# Patient Record
Sex: Male | Born: 1990 | Race: Black or African American | Hispanic: No | Marital: Single | Smoking: Never smoker
Health system: Southern US, Community
[De-identification: ages and names within clinical notes are randomized; demographics above are authoritative.]

## PROBLEM LIST (undated history)

## (undated) DIAGNOSIS — E7800 Pure hypercholesterolemia, unspecified: Secondary | ICD-10-CM

---

## 2010-07-12 ENCOUNTER — Emergency Department (HOSPITAL_COMMUNITY)
Admission: EM | Admit: 2010-07-12 | Discharge: 2010-07-12 | Disposition: A | Discharge status: Home / Self Care | Payer: Self-pay | Attending: Emergency Medicine | Admitting: Emergency Medicine

## 2010-07-12 DIAGNOSIS — L600 Ingrowing nail: Secondary | ICD-10-CM | POA: Insufficient documentation

## 2010-07-12 DIAGNOSIS — M79609 Pain in unspecified limb: Secondary | ICD-10-CM | POA: Insufficient documentation

## 2010-10-28 ENCOUNTER — Inpatient Hospital Stay (INDEPENDENT_AMBULATORY_CARE_PROVIDER_SITE_OTHER)
Admission: RE | Admit: 2010-10-28 | Discharge: 2010-10-28 | Disposition: A | Discharge status: Home / Self Care | Payer: Self-pay | Source: Ambulatory Visit | Attending: Emergency Medicine | Admitting: Emergency Medicine

## 2010-10-28 DIAGNOSIS — N342 Other urethritis: Secondary | ICD-10-CM

## 2012-09-16 ENCOUNTER — Emergency Department (HOSPITAL_COMMUNITY): Payer: No Typology Code available for payment source

## 2012-09-16 ENCOUNTER — Emergency Department (HOSPITAL_COMMUNITY)
Admission: EM | Admit: 2012-09-16 | Discharge: 2012-09-16 | Disposition: A | Discharge status: Home / Self Care | Payer: No Typology Code available for payment source | Attending: Emergency Medicine | Admitting: Emergency Medicine

## 2012-09-16 ENCOUNTER — Encounter (HOSPITAL_COMMUNITY): Payer: Self-pay | Admitting: Emergency Medicine

## 2012-09-16 DIAGNOSIS — S20219A Contusion of unspecified front wall of thorax, initial encounter: Secondary | ICD-10-CM | POA: Insufficient documentation

## 2012-09-16 DIAGNOSIS — F0781 Postconcussional syndrome: Secondary | ICD-10-CM | POA: Insufficient documentation

## 2012-09-16 DIAGNOSIS — Y9389 Activity, other specified: Secondary | ICD-10-CM | POA: Insufficient documentation

## 2012-09-16 DIAGNOSIS — S139XXA Sprain of joints and ligaments of unspecified parts of neck, initial encounter: Secondary | ICD-10-CM | POA: Insufficient documentation

## 2012-09-16 DIAGNOSIS — S0990XA Unspecified injury of head, initial encounter: Secondary | ICD-10-CM | POA: Insufficient documentation

## 2012-09-16 DIAGNOSIS — Y92410 Unspecified street and highway as the place of occurrence of the external cause: Secondary | ICD-10-CM | POA: Insufficient documentation

## 2012-09-16 DIAGNOSIS — S161XXA Strain of muscle, fascia and tendon at neck level, initial encounter: Secondary | ICD-10-CM

## 2012-09-16 HISTORY — DX: Pure hypercholesterolemia, unspecified: E78.00

## 2012-09-16 MED ORDER — NAPROXEN 375 MG PO TABS: 375.0000 mg | ORAL_TABLET | Freq: Two times a day (BID) | ORAL | Status: DC | PRN

## 2012-09-16 MED ORDER — IBUPROFEN 400 MG PO TABS
600.0000 mg | ORAL_TABLET | Freq: Once | ORAL | Status: AC
  Administered 2012-09-16: 600 mg via ORAL
  Filled 2012-09-16: qty 1
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
## 2013-06-11 ENCOUNTER — Encounter (HOSPITAL_COMMUNITY): Payer: Self-pay | Admitting: Emergency Medicine

## 2013-06-11 ENCOUNTER — Emergency Department (HOSPITAL_COMMUNITY)
Admission: EM | Admit: 2013-06-11 | Discharge: 2013-06-12 | Disposition: A | Discharge status: Home / Self Care | Payer: Managed Care, Other (non HMO) | Attending: Emergency Medicine | Admitting: Emergency Medicine

## 2013-06-11 ENCOUNTER — Emergency Department (HOSPITAL_COMMUNITY): Payer: Managed Care, Other (non HMO)

## 2013-06-11 DIAGNOSIS — F329 Major depressive disorder, single episode, unspecified: Secondary | ICD-10-CM | POA: Insufficient documentation

## 2013-06-11 DIAGNOSIS — R45851 Suicidal ideations: Secondary | ICD-10-CM | POA: Insufficient documentation

## 2013-06-11 DIAGNOSIS — F3289 Other specified depressive episodes: Secondary | ICD-10-CM | POA: Insufficient documentation

## 2013-06-11 DIAGNOSIS — F411 Generalized anxiety disorder: Secondary | ICD-10-CM | POA: Insufficient documentation

## 2013-06-11 DIAGNOSIS — S61209A Unspecified open wound of unspecified finger without damage to nail, initial encounter: Secondary | ICD-10-CM | POA: Insufficient documentation

## 2013-06-11 DIAGNOSIS — F39 Unspecified mood [affective] disorder: Secondary | ICD-10-CM | POA: Insufficient documentation

## 2013-06-11 DIAGNOSIS — Z862 Personal history of diseases of the blood and blood-forming organs and certain disorders involving the immune mechanism: Secondary | ICD-10-CM | POA: Insufficient documentation

## 2013-06-11 DIAGNOSIS — X789XXA Intentional self-harm by unspecified sharp object, initial encounter: Secondary | ICD-10-CM | POA: Insufficient documentation

## 2013-06-11 DIAGNOSIS — Z8639 Personal history of other endocrine, nutritional and metabolic disease: Secondary | ICD-10-CM | POA: Insufficient documentation

## 2013-06-11 DIAGNOSIS — Z23 Encounter for immunization: Secondary | ICD-10-CM | POA: Insufficient documentation

## 2013-06-11 DIAGNOSIS — S61409A Unspecified open wound of unspecified hand, initial encounter: Secondary | ICD-10-CM | POA: Insufficient documentation

## 2013-06-11 LAB — COMPREHENSIVE METABOLIC PANEL
ALK PHOS: 65 U/L (ref 39–117)
ALT: 46 U/L (ref 0–53)
AST: 40 U/L — AB (ref 0–37)
Albumin: 4.2 g/dL (ref 3.5–5.2)
BUN: 12 mg/dL (ref 6–23)
CALCIUM: 9.1 mg/dL (ref 8.4–10.5)
CHLORIDE: 101 meq/L (ref 96–112)
CO2: 24 meq/L (ref 19–32)
Creatinine, Ser: 0.87 mg/dL (ref 0.50–1.35)
GFR calc Af Amer: >90 mL/min (ref >90)
Glucose, Bld: 89 mg/dL (ref 70–99)
POTASSIUM: 4.6 meq/L (ref 3.7–5.3)
SODIUM: 138 meq/L (ref 137–147)
Total Protein: 7.9 g/dL (ref 6.0–8.3)

## 2013-06-11 LAB — SALICYLATE LEVEL: Salicylate Lvl: <2 mg/dL — ABNORMAL LOW (ref 2.8–20.0)

## 2013-06-11 LAB — CBC
HCT: 41.1 % (ref 39.0–52.0)
HEMOGLOBIN: 13.9 g/dL (ref 13.0–17.0)
MCH: 27.2 pg (ref 26.0–34.0)
MCHC: 33.8 g/dL (ref 30.0–36.0)
MCV: 80.4 fL (ref 78.0–100.0)
PLATELETS: 193 10*3/uL (ref 150–400)
RBC: 5.11 MIL/uL (ref 4.22–5.81)
RDW: 13.6 % (ref 11.5–15.5)
WBC: 5.5 10*3/uL (ref 4.0–10.5)

## 2013-06-11 LAB — ETHANOL: Alcohol, Ethyl (B): <11 mg/dL (ref 0–11)

## 2013-06-11 LAB — ACETAMINOPHEN LEVEL: Acetaminophen (Tylenol), Serum: <15 ug/mL (ref 10–30)

## 2013-06-11 MED ORDER — ALUM & MAG HYDROXIDE-SIMETH 200-200-20 MG/5ML PO SUSP: 30.0000 mL | ORAL | Status: DC | PRN

## 2013-06-11 MED ORDER — IBUPROFEN 200 MG PO TABS
600.0000 mg | ORAL_TABLET | Freq: Three times a day (TID) | ORAL | Status: DC | PRN
  Administered 2013-06-12: 600 mg via ORAL
  Filled 2013-06-11: qty 3

## 2013-06-11 MED ORDER — LORAZEPAM 1 MG PO TABS: 1.0000 mg | ORAL_TABLET | Freq: Three times a day (TID) | ORAL | Status: DC | PRN

## 2013-06-11 MED ORDER — ONDANSETRON HCL 4 MG PO TABS: 4.0000 mg | ORAL_TABLET | Freq: Three times a day (TID) | ORAL | Status: DC | PRN

## 2013-06-11 NOTE — ED Notes (Signed)
Patient refusing lab draw  until his hand is "checked out". Triage RN made aware.
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
## 2013-06-11 NOTE — BH Assessment (Addendum)
[facility] Assessment Progress Note  At 22:25 I [facility] in anticipation of [facility] assessment.  However, when I entered the pt's room at 22:35, pt needed further medical treatment before he was ready to be assessed.  Doylene Canninghomas Margurete Guaman, MA Triage Specialist 06/11/2013 @ 22:44
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
## 2013-06-12 DIAGNOSIS — F39 Unspecified mood [affective] disorder: Secondary | ICD-10-CM

## 2013-06-12 LAB — RAPID URINE DRUG SCREEN, HOSP PERFORMED
AMPHETAMINES: NOT DETECTED
BARBITURATES: NOT DETECTED
Benzodiazepines: NOT DETECTED
Cocaine: NOT DETECTED
OPIATES: NOT DETECTED
TETRAHYDROCANNABINOL: POSITIVE — AB

## 2013-06-12 MED ORDER — TETANUS-DIPHTH-ACELL PERTUSSIS 5-2.5-18.5 LF-MCG/0.5 IM SUSP
0.5000 mL | Freq: Once | INTRAMUSCULAR | Status: AC
  Administered 2013-06-12: 0.5 mL via INTRAMUSCULAR
  Filled 2013-06-12: qty 0.5

## 2013-06-12 MED ORDER — HYDROXYZINE HCL 10 MG PO TABS: 50.0000 mg | ORAL_TABLET | Freq: Four times a day (QID) | ORAL | Status: DC | PRN

## 2013-06-12 MED ORDER — HYDROXYZINE HCL 25 MG PO TABS
50.0000 mg | ORAL_TABLET | Freq: Once | ORAL | Status: AC
  Administered 2013-06-12: 50 mg via ORAL
  Filled 2013-06-12: qty 2

## 2013-06-12 MED ORDER — CEPHALEXIN 500 MG PO CAPS
500.0000 mg | ORAL_CAPSULE | Freq: Four times a day (QID) | ORAL | Status: DC
  Administered 2013-06-12 (×3): 500 mg via ORAL
  Filled 2013-06-12 (×3): qty 1

## 2013-06-12 NOTE — Progress Notes (Signed)
Patient ID: Nicholas Cowan, male   DOB: 12/17/1990, 23 y.o.   MRN: [id number]  Patient is going home; girlfriend is willing to have him back. [facility]. Will discharge with Hydroxyzine 50 mg BID Prn anxiety/agitation. He is to follow up for anger management and impulsivity with [facility]. He denies SI/HI/AVH.   Psychiatric Specialty Exam: Physical Exam  ROS  Blood pressure 112/68, pulse 81, temperature 98.2 F (36.8 C), temperature source Oral, resp. rate 16, height 5\' 11"  (1.803 m), weight 102.059 kg (225 lb), SpO2 100.00%.Body mass index is 31.39 kg/(m^2).  General Appearance: Casual  Eye Contact::  Fair  Speech:  Normal Rate  Volume:  Normal  Mood:  Euthymic  Affect:  Constricted  Thought Process:  Coherent, Goal Directed and Intact  Orientation:  Full (Time, Place, and Person)  Thought Content:  WDL  Suicidal Thoughts:  No  Homicidal Thoughts:  No  Memory:  Immediate;   Fair Recent;   Fair Remote;   Fair  Judgement:  Fair  Insight:  Fair  Psychomotor Activity:  Normal  Concentration:  Fair  Recall:  Fair  Akathisia:  No  Handed:  Right  AIMS (if indicated):   0  Assets:  Leisure Time Physical Health Resilience Social Support  Sleep:   fair

## 2013-06-12 NOTE — ED Notes (Signed)
Code Start is entry error.

## 2013-06-12 NOTE — ED Notes (Signed)
Pt currently denies SI/HI/AVH.  Pt states that he was hearing voices earlier.  Pt refuses to talk about the situation at the present moment.  Pt guarded and forwards little.

## 2013-06-12 NOTE — Progress Notes (Signed)
[facility] provided pt with a [employer] Card application, highlighting [facility].  °

## 2013-06-12 NOTE — BH Assessment (Signed)
LCS[facility] as [facility] is looking at potential discharge. Patient's girlfriend, Ena DawleyLekiah Solomon at [phone], reports she has no concerns should patient be discharged regarding patient's safety. Glean HessLekiah reports patient has been stressed of late over household bills and conflict with father. Both patient and patient's girlfriend are open to patient following up in community resources for therapy. Patient aware of how to [facility] and [address]. Patient willing to sign 'no harm contract.' LCSW have patient sign. Carney Bernatherine C Harrill, LCSW

## 2013-06-12 NOTE — [facility] Suicide Risk Assessment (Cosign Needed)
Suicide Risk Assessment  Discharge Assessment     Demographic Factors:  Male, Adolescent or young adult and Low socioeconomic status  Mental Status Per Nursing Assessment::   On Admission:     Current Mental Status by Physician: NA  Loss Factors: Decrease in vocational status, Decline in physical health and Financial problems/change in socioeconomic status  Historical Factors: NA  Risk Reduction Factors:   Positive social support  Continued Clinical Symptoms:  Severe Anxiety and/or Agitation  Cognitive Features That Contribute To Risk:  Closed-mindedness Loss of executive function Polarized thinking Thought constriction (tunnel vision)    Suicide Risk:  Minimal: No identifiable suicidal ideation.  Patients presenting with no risk factors but with morbid ruminations; may be classified as minimal risk based on the severity of the depressive symptoms  Discharge Diagnoses:   AXIS I:  Anxiety Disorder NOS AXIS II:  Deferred AXIS III:   Past Medical History  Diagnosis Date  . Hypercholesteremia    AXIS IV:  economic problems, educational problems, housing problems, occupational problems, other psychosocial or environmental problems, problems related to legal system/crime, problems related to social environment, problems with access to health care services and problems with primary support group AXIS V:  61-70 mild symptoms  Plan Of Care/Follow-up recommendations:  Activity:  as tolerated Diet:  regular Tests:  na Other:  na  Is patient on multiple antipsychotic therapies at discharge:  No   Has Patient had three or more failed trials of antipsychotic monotherapy by history:  No  Recommended Plan for Multiple Antipsychotic Therapies: NA  Terance Pomplun 06/12/2013, 4:09 PM

## 2013-06-12 NOTE — Consult Note (Signed)
[facility] Face-to-Face Psychiatry Consult   Reason for Consult:  Referral for psychiatric evaluation Referring Physician:  EDP Yao/Schinlever, PA-C Nicholas Cowan is an 23 y.o. male.  Assessment: AXIS I:  Mood Disorder NOS AXIS II:  Deferred AXIS III:   Past Medical History  Diagnosis Date  . Hypercholesteremia    AXIS IV:  economic problems, other psychosocial or environmental problems and problems with primary support group AXIS V:  41-50 serious symptoms  Plan:  No evidence of imminent risk to self or others at present.    Subjective:   Nicholas Cowan is a 23 y.o. male patient who presented to [facility] for evaluation of depression after sustaining a hand injury. Patient states "I lost my mind. I wasn't thinking straight." Patient states that tonight his girlfriend found condoms in his car. His girlfriend then told him "she wasn't worried about it and just wait til he found condoms in her purse." Patient states that my girlfriend "yelled at me and told me to go on to work." Patient states I don't even know how to handle things. I lost my mind and started crying." Patient states he smashed his hand into a glass table. Patient states "I am upset all the time. I'm scared to die but I think about dying. I'm tired of being upset. I'm tired of being depressed." Patient further states that he "has issues with anger." States when he gets mad sometimes "I hit myself in the face, tear my shirt or cut myself." Patient states that he has only had one incident of cutting himself and that was 01/2013. Patient states that he doesn't have anyone he can talk to; states his mother couldn't handle some of the things he needs to say and he states that his father is a Theme park manager. States "I have a lot of resentment towards my dad." Patient states that "I keep dreaming about cars getting into a wreck. I see visions of accidents and sometimes I hear voices in my dreams. I also dream that someone is trying to kill me." Patient states  that he has gained 30+ lbs over the last 6 months - a year due to eating when he is sad. Reports sleeping 5-6 hours at night; sometimes less. Reports stressors as his relationship with his girlfriend and that he lost his full-time job in December right before Christmas.  Patient denies SI or HI at this time.  HPI: 23 yo male patient who presents for evaluation of depression. HPI Elements:   Location:  Depression and Anger Outburst. Quality:  Depressed and Hopeless. Severity:  Moderate to Severe. Timing:  Frequent episodes of depression and anger. Duration:  6 months to a year. Context:  Argument with girlfriend and increased life stressors.  Past Psychiatric History: Past Medical History  Diagnosis Date  . Hypercholesteremia     reports that he has never smoked. He does not have any smokeless tobacco history on file. He reports that he does not drink alcohol or use illicit drugs. No family history on file.         Allergies:  No Known Allergies  ACT Assessment Complete:  No:   Past Psychiatric History: No Diagnosis:  Mood Disorder  Hospitalizations:  None  Outpatient Care:  None  Substance Abuse Care:  None  Self-Mutilation:  Yes. One episode of cutting 01/2013.  Suicidal Attempts:  No  Homicidal Behaviors:  No   Violent Behaviors: Yes, smashed hand through glass table.    Place of Residence:  Lives with his  girlfriend.  Marital Status:  Single Employed/Unemployed:  Employed part-time EducationField seismologist Family Supports:  Mom and Father Objective: Blood pressure 137/76, pulse 68, temperature 98.2 F (36.8 C), temperature source Oral, resp. rate 15, height 5' 11"  (1.803 m), weight 102.059 kg (225 lb), SpO2 99.00%.Body mass index is 31.39 kg/(m^2). Results for orders placed during the hospital encounter of 06/11/13 (from the past 72 hour(s))  ACETAMINOPHEN LEVEL     Status: None   Collection Time    06/11/13  9:20 PM      Result Value Range    Acetaminophen (Tylenol), Serum <15.0  10 - 30 ug/mL   Comment:            THERAPEUTIC CONCENTRATIONS VARY     SIGNIFICANTLY. A RANGE OF 10-30     ug/mL MAY BE AN EFFECTIVE     CONCENTRATION FOR MANY PATIENTS.     HOWEVER, SOME ARE BEST TREATED     AT CONCENTRATIONS OUTSIDE THIS     RANGE.     ACETAMINOPHEN CONCENTRATIONS     >150 ug/mL AT 4 HOURS AFTER     INGESTION AND >50 ug/mL AT 12     HOURS AFTER INGESTION ARE     OFTEN ASSOCIATED WITH TOXIC     REACTIONS.  CBC     Status: None   Collection Time    06/11/13  9:20 PM      Result Value Range   WBC 5.5  4.0 - 10.5 K/uL   RBC 5.11  4.22 - 5.81 MIL/uL   Hemoglobin 13.9  13.0 - 17.0 g/dL   HCT 41.1  39.0 - 52.0 %   MCV 80.4  78.0 - 100.0 fL   MCH 27.2  26.0 - 34.0 pg   MCHC 33.8  30.0 - 36.0 g/dL   RDW 13.6  11.5 - 15.5 %   Platelets 193  150 - 400 K/uL  COMPREHENSIVE METABOLIC PANEL     Status: Abnormal   Collection Time    06/11/13  9:20 PM      Result Value Range   Sodium 138  137 - 147 mEq/L   Potassium 4.6  3.7 - 5.3 mEq/L   Comment: MODERATE HEMOLYSIS     HEMOLYSIS AT THIS LEVEL MAY AFFECT RESULT   Chloride 101  96 - 112 mEq/L   CO2 24  19 - 32 mEq/L   Glucose, Bld 89  70 - 99 mg/dL   BUN 12  6 - 23 mg/dL   Creatinine, Ser 0.87  0.50 - 1.35 mg/dL   Calcium 9.1  8.4 - 10.5 mg/dL   Total Protein 7.9  6.0 - 8.3 g/dL   Albumin 4.2  3.5 - 5.2 g/dL   AST 40 (*) 0 - 37 U/L   Comment: MODERATE HEMOLYSIS     HEMOLYSIS AT THIS LEVEL MAY AFFECT RESULT   ALT 46  0 - 53 U/L   Comment: MODERATE HEMOLYSIS     HEMOLYSIS AT THIS LEVEL MAY AFFECT RESULT   Alkaline Phosphatase 65  39 - 117 U/L   Comment: MODERATE HEMOLYSIS     HEMOLYSIS AT THIS LEVEL MAY AFFECT RESULT   Total Bilirubin <0.2 (*) 0.3 - 1.2 mg/dL   GFR calc non Af Amer >90  >90 mL/min   GFR calc Af Amer >90  >90 mL/min   Comment: (NOTE)     The eGFR has been calculated using the CKD EPI equation.     This calculation has  not been validated in all clinical  situations.     eGFR's persistently <90 mL/min signify possible Chronic Kidney     Disease.  ETHANOL     Status: None   Collection Time    06/11/13  9:20 PM      Result Value Range   Alcohol, Ethyl (B) <11  0 - 11 mg/dL   Comment:            LOWEST DETECTABLE LIMIT FOR     SERUM ALCOHOL IS 11 mg/dL     FOR MEDICAL PURPOSES ONLY  SALICYLATE LEVEL     Status: Abnormal   Collection Time    06/11/13  9:20 PM      Result Value Range   Salicylate Lvl <4.9 (*) 2.8 - 20.0 mg/dL   Labs are reviewed and are essentially negative. No UDS available for review.  Current Facility-Administered Medications  Medication Dose Route Frequency Provider Last Rate Last Dose  . alum & mag hydroxide-simeth (MAALOX/MYLANTA) 200-200-20 MG/5ML suspension 30 mL  30 mL Oral PRN Arville Lime Schinlever, PA-C      . cephALEXin (KEFLEX) capsule 500 mg  500 mg Oral Q6H Catherine E Schinlever, PA-C      . hydrOXYzine (ATARAX/VISTARIL) tablet 50 mg  50 mg Oral Once Lurena Nida, NP      . ibuprofen (ADVIL,MOTRIN) tablet 600 mg  600 mg Oral Q8H PRN Remer Macho, PA-C      . LORazepam (ATIVAN) tablet 1 mg  1 mg Oral Q8H PRN Arville Lime Schinlever, PA-C      . ondansetron (ZOFRAN) tablet 4 mg  4 mg Oral Q8H PRN Remer Macho, PA-C       No current outpatient prescriptions on file.    Psychiatric Specialty Exam:     Blood pressure 137/76, pulse 68, temperature 98.2 F (36.8 C), temperature source Oral, resp. rate 15, height 5' 11"  (1.803 m), weight 102.059 kg (225 lb), SpO2 99.00%.Body mass index is 31.39 kg/(m^2).  General Appearance: Fairly Groomed  Engineer, water::  Fair  Speech:  Clear and Coherent  Volume:  Decreased  Mood:  Depressed  Affect:  Blunt  Thought Process:  Coherent  Orientation:  Full (Time, Place, and Person)  Thought Content:  WDL  Suicidal Thoughts:  No  Homicidal Thoughts:  No  Memory:  Immediate;   Good Recent;   Good Remote;   Good  Judgement:  Fair  Insight:  Fair   Psychomotor Activity:  Normal  Concentration:  Good  Recall:  Good  Akathisia:  No  Handed:  Right  AIMS (if indicated):     Assets:  Desire for Improvement Housing  Sleep:      Treatment Plan Summary: Patient will be evaluated by psychiatry in the morning.  Serena Colonel, FNP-BC 06/12/2013 1:15 AM

## 2013-06-12 NOTE — ED Provider Notes (Signed)
Medical screening examination/treatment/procedure(s) were performed by non-physician practitioner and as supervising physician I was immediately available for consultation/collaboration.  EKG Interpretation   None         Richardean Canalavid H Johnattan Strassman, MD 06/12/13 1459

## 2013-06-15 NOTE — Progress Notes (Signed)
Patient was examined by me, seems to be fairly stable but does acknowledge his problems with anger and is willing to followup with [facility]. Patient denies any suicidal ideation, any homicidal ideation, any delusions or paranoia her patient also denies having any thoughts of hurting himself or others. Patient can be discharged home and is to return to girlfriend's house

## 2013-06-16 NOTE — Consult Note (Signed)
Face to face evaluation, note reviewed and agreed with 

## 2013-06-29 ENCOUNTER — Encounter (HOSPITAL_COMMUNITY): Payer: Self-pay | Admitting: Emergency Medicine

## 2013-06-29 ENCOUNTER — Emergency Department (HOSPITAL_COMMUNITY)
Admission: EM | Admit: 2013-06-29 | Discharge: 2013-06-29 | Disposition: A | Discharge status: Home / Self Care | Payer: Managed Care, Other (non HMO) | Attending: Emergency Medicine | Admitting: Emergency Medicine

## 2013-06-29 DIAGNOSIS — Z862 Personal history of diseases of the blood and blood-forming organs and certain disorders involving the immune mechanism: Secondary | ICD-10-CM | POA: Insufficient documentation

## 2013-06-29 DIAGNOSIS — Z4802 Encounter for removal of sutures: Secondary | ICD-10-CM | POA: Insufficient documentation

## 2013-06-29 DIAGNOSIS — G8911 Acute pain due to trauma: Secondary | ICD-10-CM | POA: Insufficient documentation

## 2013-06-29 DIAGNOSIS — Z8639 Personal history of other endocrine, nutritional and metabolic disease: Secondary | ICD-10-CM | POA: Insufficient documentation

## 2013-06-29 DIAGNOSIS — M79609 Pain in unspecified limb: Secondary | ICD-10-CM | POA: Insufficient documentation

## 2013-06-29 MED ORDER — IBUPROFEN 600 MG PO TABS: 600.0000 mg | ORAL_TABLET | Freq: Four times a day (QID) | ORAL | Status: DC | PRN

## 2013-06-29 NOTE — ED Notes (Signed)
Wound care reviewed with pt. Dressings provided

## 2013-06-29 NOTE — ED Notes (Signed)
Pt here for suture removal from left. Lac is healing no redness and edges approximate. Sutures Place on 06/11/13.

## 2013-06-29 NOTE — Discharge Instructions (Signed)
Call for a follow up appointment with a Family or Primary Care Provider.  °Return if Symptoms worsen.   °Take medication as prescribed.  ° ° °Emergency Department Resource Guide °1) Find a Doctor and Pay Out of Pocket °Although you won't have to find out who is covered by your insurance plan, it is a good idea to ask around and get recommendations. You will then need to call the office and see if the doctor you have chosen will accept you as a new patient and what types of options they offer for patients who are self-pay. Some doctors offer discounts or will set up payment plans for their patients who do not have insurance, but you will need to ask so you aren't surprised when you get to your appointment. ° °2) Contact Your Local Health Department °Not all health departments have doctors that can see patients for sick visits, but many do, so it is worth a call to see if yours does. If you don't know where your local health department is, you can check in your phone book. The CDC also has a tool to help you locate your state's health department, and many state websites also have listings of all of their local health departments. ° °3) Find a Walk-in Clinic °If your illness is not likely to be very severe or complicated, you may want to try a walk in clinic. These are popping up all over the country in pharmacies, drugstores, and shopping centers. They're usually staffed by nurse practitioners or physician assistants that have been trained to treat common illnesses and complaints. They're usually fairly quick and inexpensive. However, if you have serious medical issues or chronic medical problems, these are probably not your best option. ° °No Primary Care Doctor: °- Call Health Connect at  832-8000 - they can help you locate a primary care doctor that  accepts your insurance, provides certain services, etc. °- Physician Referral Service- 1-800-533-3463 ° °Chronic Pain Problems: °Organization         Address  Phone    Notes  °[facility]  (336) 297-2271 Patients need to be referred by their primary care doctor.  ° °Medication Assistance: °Organization         Address  Phone   Notes  °[place] [facility] [address] °[place], [place] [address] (336) 641-8030 --Must be a resident of [place] °-- Must have NO insurance coverage whatsoever (no Medicaid/ Medicare, etc.) °-- The pt. MUST have a primary care doctor that directs their care regularly and follows them in the community °  °[facility]  (866) 331-1348   °[employer] Way  (888) 892-1162   ° °Agencies that provide inexpensive medical care: °Organization         Address  Phone   Notes  °[facility]  (336) 832-8035   °[facility]    (336) 832-7272   °[facility] [address] °[place], [place] [address] (336) 832-4777   °[facility] [address], °[place] (336) 271-4999   °[facility]    (336) 373-0678   °[facility]    (336) 272-1050   °[facility] ° [address], [place] Phone:  (336) 832-4444, Fax:  (336) 832-4440 Hours of Operation:  9 am - 6 pm, M-F.  Also accepts Medicaid/Medicare and self-pay.  °[facility] ° [address], [place]   Phone: (336) 832-3150, Fax: (336) 832-3151. Hours of Operation:  8:30 am - 5:30 pm, M-F.  Also accepts Medicaid and self-pay.  °[facility] [place] [address], [place] Phone: (336) 878-6027   °[facility] [address], [place], [place] (336)723-1848, Ext. 123 Mondays & Thursdays: 7-9 AM.  First 15 patients are seen on a first come, first serve basis. °  ° °Medicaid-accepting [place] Providers: ° °Organization         Address  Phone   Notes  °[facility] [address], [place] (336) 641-2100 Also accepts self-pay patients.  °[facility]  [address], [place] ° (336) 856-9996   °[facility] [address], [place] (336) 288-8857   °[facility] [address] [place] [address], [place] (336) 299-7000   °Veita Bland [address], [place]  ° (336) 373-1557 Only accepts [place] Access Medicaid patients after they have their name applied to their card.  ° °Self-Pay (no insurance) in [place]: ° °Organization         Address  Phone   Notes  °Sickle Cell Patients, [facility] [address], [place] (336) 832-1970   °[facility] [address], [place] (336) 832-4400   °[facility] ° [address], [place] (336) 992-4800   °[facility]/Dr. Osei-Bonsu ° [address] [place] [address], [place] or [address], [place] (336) 841-8500 Phone number for both [place] and [place] locations is the same.  °[facility] [address], [place] (336) 299-0000   °[facility] [address] [place] [address], [place] or [address] (336) 852-7530 °(336) 878-2260   °[facility] [address], [place] (336) 350-1642, phone; (336) 294-5005, fax Sees patients 1st and 3rd Saturday of every month.  Must not qualify for public or private insurance (i.e. Medicaid, Medicare, [place] Health Choice, Veterans' Benefits) • Household income should be no more than 200% of the poverty level •The clinic cannot treat you if you are pregnant or think you are pregnant • Sexually transmitted diseases are not treated at the clinic.  ° ° °Dental Care: °Organization         Address  Phone  Notes  °[facility] [address],  (336) 641-6152 Accepts children up to age 21 who are enrolled in Medicaid or [place] Health Choice; pregnant women with a Medicaid card; and children who have  applied for Medicaid or [place] Health Choice, but were declined, whose parents can pay a reduced fee at time of service.  °[facility] [place]  [address], [place] (336) 641-7733 Accepts children up to age 21 who are enrolled in Medicaid or [place] Health Choice; pregnant women with a Medicaid card; and children who have applied for Medicaid or [place] Health Choice, but were declined, whose parents can pay a reduced fee at time of service.  °[facility] ° [address],  (336) 641-4533 Patients are seen by appointment only. Walk-ins are not accepted. [facility] will see patients 18 years of age and older. °Monday - Tuesday (8am-5pm) °Most Wednesdays (8:30-5pm) °$30 per visit, cash only  °[facility] ° [address], [place] (336) 641-4533 Patients are seen by appointment only. Walk-ins are not accepted. [facility] will see patients 18 years of age and older. °One Wednesday Evening (Monthly: Volunteer Based).  $30 per visit, cash only  °[facility]  (919) 537-3737 for adults; Children under age 4, call Graduate Pediatric Dentistry at (919) [phone]. Children aged 4-14, please call (919) [phone] to request a pediatric application. ° Dental services are provided in all areas of dental care including fillings, crowns and bridges, complete and partial dentures, implants, gum treatment, root canals, and extractions. Preventive care is also provided. Treatment is provided to both adults and children. °Patients are selected via a lottery and there is often a waiting list. °  °[facility] [address], °[place] ° (336) 763-8833 [email or web address] °  °[facility] [address], [place], [place] (336)723-1848, Ext. 123 Second and Fourth Thursday of each month, opens at 6:30 AM; Clinic ends at 9 AM.  Patients are seen on a first-come first-served basis, and a  limited number are seen during each clinic.  ° °[facility] ° [address], [place], [place] (336) 723-7904   Eligibility Requirements °You must have lived in [place], [place], or [place] counties for at least the last three months. °  You cannot be eligible for state or federal sponsored healthcare insurance, including Veterans Administration, Medicaid, or Medicare. °  You generally cannot be eligible for healthcare insurance through your employer.  °  How to apply: °Eligibility screenings are held every Tuesday and Wednesday afternoon from 1:00 pm until 4:00 pm. You do not need an appointment for the interview!  °[facility] [address], [place], [place] 336-631-2330   °[facility]  336-342-8273   °[facility]  336-703-3100   °[facility]  336-570-6415   ° °Behavioral Health Resources in the Community: °Intensive Outpatient Programs °Organization         Address  Phone  Notes  °[facility] [address], [place], [place] 336-878-6098   °[facility] [address], [place], [place] 336-832-9800   °[facility] [address], [place], [place] ° 336-882-2125   °[facility] [address],  °[place], [place] 1-800-853-5163 or 336-641-4981   °Substance Abuse Resources °Organization         Address  Phone  Notes  °[facility]  336-882-2125   °[facility]  336-784-9470   °[facility]  336-285-9073   °[facility]  336-845-3988   °Residential & Outpatient Substance Abuse Program  1-800-659-3381   °Psychological Services °Organization         Address  Phone  Notes  °[facility]  336- 832-9600   °[facility]  336- 378-7881   °[facility] [address], [place] 1-800-853-5163 or 336-641-4981   ° °Mobile Crisis Teams °Organization          Address  Phone  Notes  °[facility], Mobile Crisis Care Unit  1-877-626-1772   °Assertive °[facility] ° [address] [place], [place] 336-834-9664   °Sharon DeEsch [address] °[place]  336-554-5454   ° °Self-Help/Support Groups °Organization         Address  Phone             Notes  °[facility] [place] [facility] - variety of   support groups  336- 373-1402 Call for more information  °Narcotics Anonymous (NA), [facility] [address], °[place]  2 meetings at this location  ° °Residential Treatment Programs °Organization         Address  Phone  Notes  °[facility] [address],    °[place]  1-866-801-8205   °[facility] ° [address], [place], [place] 704-293-8524   °[facility] [address], [place] 336-845-3988 Admissions: 8am-3pm M-F  °[facility] [address]    °[place], [place] 336-841-1104   °[facility] [address], [place], [place] 336-379-7146   °[facility] [address]  °[place], [place] 336-285-9073   °[facility] - Intensive Outpatient [address], [place], [place] 336-852-3033   °[facility] [address]  °[place], [place] 1-877-615-2722 or 336-784-9470   °[facility] ([facility] [address] , [place] 336-227-7417 Accepts Medicaid  °[facility] [address]  °[place] 1-800-659-3381 Substance Abuse/Addiction Treatment  ° °[place] Behavioral Health Resources °Organization         Address  Phone  Notes  °[facility]  (888) 581-9988   °Julie Brannon, PhD [address] [place], [place]   (336) 349-5553 or (336) 951-0000   °[facility]   [address] °[place], [place] (336) 349-4454   °[facility] [address], [place], [place] (336) 342-8316 Insurance/Medicaid/sponsorship  through [facility]  °[facility] [address]                                    [place], [place] (336) 342-8316 Therapy/tele-psych/case  °[facility] [address]  ° [place], [place] (336) 349-2233    °Dr. Arfeen  (336) 349-4544   °[facility] [place]  [employer] [place] [facility] 1) [address],  °2) [address], [place] °3)  [address], [place] (336) 349-3220 °(336) 342-7768 ° °(336) 342-8140   °[place] Child Abuse [facility]line (336) 342-1394 or (336) 342-3537 (After Hours)    ° °

## 2013-06-29 NOTE — ED Provider Notes (Signed)
CSN: [id number]     Arrival date & time 06/29/13  1336 History  This chart was scribed for non-physician practitioner Mellody DrownLauren Machai Desmith, PA-C working with Flint MelterElliott L Wentz, MD by Dorothey Basemania Sutton, ED Scribe. This patient was seen in room WTR7/WTR7 and the patient's care was started at 3:13 PM.    Chief Complaint  Patient presents with  . Suture / Staple Removal    The history is provided by the patient. No language interpreter was used.   HPI Comments: Nicholas Cowan is a 23 y.o. male who presents to the Emergency Department requesting removal of 10 sutures from the left hand for a laceration that he sustained on 06/11/2013. He reports some associated mild pain to the area. He denies any surrounding erythema or drainage from the area. He denies fever, chills, nausea, emesis. Patient reports that his tetanus was updated at that time. Patient has a history of hypercholesterolemia.   Past Medical History  Diagnosis Date  . Hypercholesteremia    History reviewed. No pertinent past surgical history. No family history on file. History  Substance Use Topics  . Smoking status: Never Smoker   . Smokeless tobacco: Not on file  . Alcohol Use: No    Review of Systems  Constitutional: Negative for fever and chills.  Gastrointestinal: Negative for nausea and vomiting.  Musculoskeletal: Positive for myalgias.  Skin: Positive for wound. Negative for color change.   Allergies  Review of patient's allergies indicates no known allergies.  Home Medications   Current Outpatient Rx  Name  Route  Sig  Dispense  Refill  . hydrOXYzine (ATARAX/VISTARIL) 10 MG tablet   Oral   Take 5 tablets (50 mg total) by mouth every 6 (six) hours as needed for anxiety.   60 tablet   0    Triage Vitals: BP 143/71  Pulse 96  Temp(Src) 98.3 F (36.8 C) (Oral)  Resp 18  SpO2 100%  Physical Exam  Nursing note and vitals reviewed. Constitutional: He is oriented to person, place, and time. He appears well-developed and  well-nourished. No distress.  HENT:  Head: Normocephalic and atraumatic.  Eyes: Conjunctivae are normal.  Neck: Normal range of motion. Neck supple.  Pulmonary/Chest: Effort normal. No respiratory distress.  Abdominal: He exhibits no distension.  Musculoskeletal: Normal range of motion.  Neurological: He is alert and oriented to person, place, and time.  Skin: Skin is warm and dry. No erythema.  Healing laceration to the medial aspect of the left hand with 10 intact sutures. No erythema, drainage, fluctuance, induration, or other signs of infection.   Psychiatric: He has a normal mood and affect. His behavior is normal.    ED Course  Procedures (including critical care time)  COORDINATION OF CARE: 3:15 PM- Will remove the sutures. Discussed treatment plan with patient at bedside and patient verbalized agreement.   3:34 PM- Sutures successfully removed without complication. Patient is complaining of tenderness over the area. No drainage appreciated. Will discharge patient with ibuprofen. Patient advised of at-home wound care. Discussed treatment plan with patient at bedside and patient verbalized agreement.    Labs Review Labs Reviewed - No data to display Imaging Review No results found.  EKG Interpretation   None       MDM   Final diagnoses:  Visit for suture removal    Pt for wound check healing wound to left hand, no drainage, erythema.Mild tenderness to medial aspect of hand at the 5th digit with mild swelling, no increased warmth, likely healing  process. EMR shows 12 sutures placed on 06/12/2013 and tetanus updated at that time. Nurse removed 10 sutures.  Discussed treatment plan with the patient. Return precautions given. Reports understanding and no other concerns at this time.  Patient is stable for discharge at this time.   Meds given in ED:  Medications - No data to display  New Prescriptions   IBUPROFEN (ADVIL,MOTRIN) 600 MG TABLET    Take 1 tablet (600 mg  total) by mouth every 6 (six) hours as needed. Take with meals    I personally performed the services described in this documentation, which was scribed in my presence. The recorded information has been reviewed and is accurate.     Clabe Seal, PA-C 07/01/13 1349

## 2013-06-29 NOTE — Progress Notes (Signed)
[facility] provided pt with a list of primary care resources, [facility] information, and a [facility] Card application.  °

## 2013-07-01 NOTE — ED Provider Notes (Signed)
Medical screening examination/treatment/procedure(s) were performed by non-physician practitioner and as supervising physician I was immediately available for consultation/collaboration.  Flint MelterElliott L Shanti Agresti, MD 07/01/13 1538

## 2015-01-06 ENCOUNTER — Encounter (HOSPITAL_COMMUNITY): Payer: Self-pay | Admitting: Family Medicine

## 2015-01-06 ENCOUNTER — Emergency Department (HOSPITAL_COMMUNITY)
Admission: EM | Admit: 2015-01-06 | Discharge: 2015-01-06 | Disposition: A | Discharge status: Home / Self Care | Payer: Managed Care, Other (non HMO) | Attending: Emergency Medicine | Admitting: Emergency Medicine

## 2015-01-06 DIAGNOSIS — Z8639 Personal history of other endocrine, nutritional and metabolic disease: Secondary | ICD-10-CM | POA: Insufficient documentation

## 2015-01-06 DIAGNOSIS — R1032 Left lower quadrant pain: Secondary | ICD-10-CM

## 2015-01-06 NOTE — ED Notes (Signed)
Pt here for left groin pain. sts was hurting at work yesterday and sts he got some rest and it actually feels better. sts needs note for work.

## 2015-01-06 NOTE — Discharge Instructions (Signed)

## 2015-01-06 NOTE — ED Provider Notes (Signed)
CSN: [id number]     Arrival date & time 01/06/15  1602 History  This chart was scribed for Nicholas Pel, PA-C, working with Melene Plan, DO by Octavia Heir, ED Scribe. This patient was seen in room TR06C/TR06C and the patient's care was started at 4:44 PM.    Chief Complaint  Patient presents with  . Groin Pain     The history is provided by the patient. No language interpreter was used.   HPI Comments: Nicholas Cowan is a 24 y.o. male who presents to the Emergency Department complaining of sudden onset resolved left sided groin pain onset yesterday. a. He states he was up all night partying and got about one hour of sleep. Pt notes while he was moving a couch at his job  he felt a pull on the left side of his groin and convinced his boss to left him go home around 10 am. After resting all day he woke up with no more groin pain or symptoms. Completely resolved. His boss said he needed and ER/provider note in order to go back to work and not be fired. . Pt notes he is here to get a work note. Pt denies testicle pain and penile discharge, recent sexual activity. No pain with ROM, dysuria, hx of injury.  Past Medical History  Diagnosis Date  . Hypercholesteremia    History reviewed. No pertinent past surgical history. History reviewed. No pertinent family history. Social History  Substance Use Topics  . Smoking status: Never Smoker   . Smokeless tobacco: None  . Alcohol Use: No    Review of Systems  Constitutional: Negative for fever.  Genitourinary: Negative for discharge and testicular pain.   Allergies  Review of patient's allergies indicates no known allergies.  Home Medications   Prior to Admission medications   Medication Sig Start Date End Date Taking? Authorizing Provider  ibuprofen (ADVIL,MOTRIN) 600 MG tablet Take 1 tablet (600 mg total) by mouth every 6 (six) hours as needed. Take with meals 06/29/13   Mellody Drown, PA-C   Triage vitals: BP 132/69 mmHg  Pulse 69   Temp(Src) 98.3 F (36.8 C)  Resp 18  SpO2 98% Physical Exam  Constitutional: He appears well-developed and well-nourished. No distress.  HENT:  Head: Normocephalic and atraumatic.  Eyes: Right eye exhibits no discharge. Left eye exhibits no discharge.  Pulmonary/Chest: Effort normal. No respiratory distress.  Musculoskeletal:       Left hip: He exhibits normal range of motion, normal strength, no tenderness, no bony tenderness, no swelling, no crepitus, no deformity and no laceration.  Neurological: He is alert. Coordination normal.  Skin: No rash noted. He is not diaphoretic.  Psychiatric: He has a normal mood and affect. His behavior is normal.  Nursing note and vitals reviewed.  ED Course  Procedures  DIAGNOSTIC STUDIES: Oxygen Saturation is 98% on RA, normal by my interpretation.  COORDINATION OF CARE:  4:47 PM Discussed treatment plan which includes giving pt a work note with pt at bedside and pt agreed to plan.  Labs Review Labs Reviewed - No data to display  Imaging Review No results found. I have personally reviewed and evaluated these images and lab results as part of my medical decision-making.   EKG Interpretation None      MDM   Final diagnoses:  Groin pain, left    Pt given note without restrictions to go back to work.  Medications - No data to display  23 y.o.Nicholas Cowan's evaluation in the  Emergency Department is complete. It has been determined that no acute conditions requiring further emergency intervention are present at this time. The patient/guardian have been advised of the diagnosis and plan. We have discussed signs and symptoms that warrant return to the ED, such as changes or worsening in symptoms.  Vital signs are stable at discharge. Filed Vitals:   01/06/15 1612  BP: 132/69  Pulse: 69  Temp: 98.3 F (36.8 C)  Resp: 18    Patient/guardian has voiced understanding and agreed to follow-up with the PCP or specialist.  I personally  performed the services described in this documentation, which was scribed in my presence. The recorded information has been reviewed and is accurate.   Nicholas Pel, PA-C 01/06/15 1659  Melene Plan, DO 01/06/15 2259

## 2015-12-30 ENCOUNTER — Emergency Department (HOSPITAL_COMMUNITY): Payer: Managed Care, Other (non HMO)

## 2015-12-30 ENCOUNTER — Encounter (HOSPITAL_COMMUNITY): Payer: Self-pay | Admitting: Emergency Medicine

## 2015-12-30 ENCOUNTER — Emergency Department (HOSPITAL_COMMUNITY)
Admission: EM | Admit: 2015-12-30 | Discharge: 2015-12-30 | Disposition: A | Discharge status: Home / Self Care | Payer: Managed Care, Other (non HMO) | Attending: Emergency Medicine | Admitting: Emergency Medicine

## 2015-12-30 DIAGNOSIS — Y999 Unspecified external cause status: Secondary | ICD-10-CM | POA: Diagnosis not present

## 2015-12-30 DIAGNOSIS — S134XXA Sprain of ligaments of cervical spine, initial encounter: Secondary | ICD-10-CM | POA: Diagnosis not present

## 2015-12-30 DIAGNOSIS — S39012A Strain of muscle, fascia and tendon of lower back, initial encounter: Secondary | ICD-10-CM | POA: Diagnosis not present

## 2015-12-30 DIAGNOSIS — Y92410 Unspecified street and highway as the place of occurrence of the external cause: Secondary | ICD-10-CM | POA: Diagnosis not present

## 2015-12-30 DIAGNOSIS — Y939 Activity, unspecified: Secondary | ICD-10-CM | POA: Insufficient documentation

## 2015-12-30 DIAGNOSIS — M7918 Myalgia, other site: Secondary | ICD-10-CM

## 2015-12-30 DIAGNOSIS — S0990XA Unspecified injury of head, initial encounter: Secondary | ICD-10-CM | POA: Diagnosis present

## 2015-12-30 MED ORDER — HYDROCODONE-ACETAMINOPHEN 5-325 MG PO TABS
2.0000 | ORAL_TABLET | Freq: Once | ORAL | Status: AC
  Administered 2015-12-30: 2 via ORAL
  Filled 2015-12-30: qty 2

## 2015-12-30 MED ORDER — HYDROCODONE-ACETAMINOPHEN 5-325 MG PO TABS: 1.0000 | ORAL_TABLET | Freq: Four times a day (QID) | ORAL | 0 refills | Status: AC | PRN

## 2015-12-30 MED ORDER — CYCLOBENZAPRINE HCL 10 MG PO TABS: 10.0000 mg | ORAL_TABLET | Freq: Three times a day (TID) | ORAL | 0 refills | Status: AC | PRN

## 2015-12-30 MED ORDER — NAPROXEN 500 MG PO TABS: 500.0000 mg | ORAL_TABLET | Freq: Two times a day (BID) | ORAL | 0 refills | Status: AC
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
## 2016-09-26 ENCOUNTER — Emergency Department (HOSPITAL_COMMUNITY): Payer: No Typology Code available for payment source

## 2016-09-26 ENCOUNTER — Encounter (HOSPITAL_COMMUNITY): Payer: Self-pay | Admitting: *Deleted

## 2016-09-26 ENCOUNTER — Emergency Department (HOSPITAL_COMMUNITY)
Admission: EM | Admit: 2016-09-26 | Discharge: 2016-09-26 | Disposition: A | Discharge status: Home / Self Care | Payer: No Typology Code available for payment source | Attending: Emergency Medicine | Admitting: Emergency Medicine

## 2016-09-26 DIAGNOSIS — S161XXA Strain of muscle, fascia and tendon at neck level, initial encounter: Secondary | ICD-10-CM | POA: Insufficient documentation

## 2016-09-26 DIAGNOSIS — Y999 Unspecified external cause status: Secondary | ICD-10-CM | POA: Diagnosis not present

## 2016-09-26 DIAGNOSIS — Y92410 Unspecified street and highway as the place of occurrence of the external cause: Secondary | ICD-10-CM | POA: Diagnosis not present

## 2016-09-26 DIAGNOSIS — Y939 Activity, unspecified: Secondary | ICD-10-CM | POA: Insufficient documentation

## 2016-09-26 DIAGNOSIS — S199XXA Unspecified injury of neck, initial encounter: Secondary | ICD-10-CM | POA: Diagnosis present

## 2016-09-26 MED ORDER — TRAMADOL HCL 50 MG PO TABS: 50.0000 mg | ORAL_TABLET | Freq: Four times a day (QID) | ORAL | 0 refills | Status: AC | PRN

## 2016-09-26 MED ORDER — IBUPROFEN 800 MG PO TABS: 800.0000 mg | ORAL_TABLET | Freq: Three times a day (TID) | ORAL | 0 refills | Status: AC | PRN
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
## 2016-09-26 NOTE — ED Triage Notes (Signed)
No answer when called 
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
## 2016-09-26 NOTE — Discharge Instructions (Signed)
Return here as needed. Follow up with a primary doctor. °
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
